# Patient Record
Sex: Male | Born: 1995 | Race: White | Hispanic: No | Marital: Married | Smoking: Never smoker
Health system: Southern US, Community
[De-identification: ages and names within clinical notes are randomized; demographics above are authoritative.]

## PROBLEM LIST (undated history)

## (undated) HISTORY — PX: TONSILLECTOMY AND ADENOIDECTOMY: SUR1326

---
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
## 2017-01-01 ENCOUNTER — Encounter: Payer: Self-pay | Admitting: Physical Therapy

## 2017-01-01 ENCOUNTER — Ambulatory Visit: Payer: 59 | Admitting: Physical Therapy

## 2017-01-01 DIAGNOSIS — M25511 Pain in right shoulder: Secondary | ICD-10-CM | POA: Diagnosis not present

## 2017-01-01 DIAGNOSIS — M25611 Stiffness of right shoulder, not elsewhere classified: Secondary | ICD-10-CM

## 2017-01-01 DIAGNOSIS — R2231 Localized swelling, mass and lump, right upper limb: Secondary | ICD-10-CM

## 2017-01-01 NOTE — Therapy (Signed)
[place], [place], [address] Phone: [phone]   Fax:  [phone]  Physical Therapy Treatment  Patient Details  Name: Omar Anderson MRN: [id number] Date of Birth: 09-20-1995 Referring Provider: Jerline Pain  Encounter Date: 01/01/2017      PT End of Session - 01/01/17 0829    Visit Number 10   Date for PT Re-Evaluation 01/28/17   PT Start Time 0758   PT Stop Time 0847   PT Time Calculation (min) 49 min   Activity Tolerance Patient tolerated treatment well   Behavior During Therapy [facility] for tasks assessed/performed      History reviewed. No pertinent past medical history.  Past Surgical History:  Procedure Laterality Date  . TONSILLECTOMY AND ADENOIDECTOMY      There were no vitals filed for this visit.      Subjective Assessment - 01/01/17 0800    Subjective Reports he was sore after the last visit and thought he saw some bruising above the clavicle   Currently in Pain? Yes   Pain Score 4    Pain Location Shoulder   Pain Orientation Right;Anterior   Aggravating Factors  exercises "made me sore"                         [facility] Adult PT Treatment/Exercise - 01/01/17 0001      Shoulder Exercises: Standing   External Rotation Right;20 reps;Theraband   Theraband Level (Shoulder External Rotation) Level 1 (Yellow)   Internal Rotation Strengthening;Right;20 reps;Theraband   Theraband Level (Shoulder Internal Rotation) Level 1 (Yellow)   Extension Strengthening;Both;20 reps;Theraband   Theraband Level (Shoulder Extension) Level 2 (Red)   Row Strengthening;Both;20 reps;Theraband   Theraband Level (Shoulder Row) Level 2 (Red)   Other Standing Exercises Wand exercises all directions     Shoulder Exercises: ROM/Strengthening   UBE (Upper Arm Bike) L 5 3 fwd/3 back   Other ROM/Strengthening Exercises seated row 10#   Other ROM/Strengthening Exercises gentle weight  bearing through the arm on table, weight shift back and forth, 10# triceps in a 1/2 ROM, 2# biceps all with elbow at side, 3# shrugs     Electrical Stimulation   Electrical Stimulation Location right upper trap area   Electrical Stimulation Action IFC   Electrical Stimulation Parameters seated   Electrical Stimulation Goals Pain     Vasopneumatic   Number Minutes Vasopneumatic  15 minutes   Vasopnuematic Location  Shoulder   Vasopneumatic Pressure Low   Vasopneumatic Temperature  36     Manual Therapy   Passive ROM RT shld all motions                  PT Short Term Goals - 12/14/16 0925      PT SHORT TERM GOAL #1   Title independent with initial HEP   Status Achieved           PT Long Term Goals - 12/21/16 1353      PT LONG TERM GOAL #1   Title decrease pain 50%   Status Partially Met     PT LONG TERM GOAL #4   Title dresss and do hair without difficulty   Status Partially Met               Plan - 01/01/17 0830    Clinical Impression Statement Patient had some increased pain after the last treatment, so I backed down.  All of his ROM is good except abduction causes him pain.   PT Next Visit Plan slowly advance plan/protocol as pain is our guide   Consulted and Agree with Plan of Care Patient      Patient will benefit from skilled therapeutic intervention in order to improve the following deficits and impairments:  Decreased mobility, Decreased strength, Postural dysfunction, Improper body mechanics, Pain, Increased muscle spasms, Decreased range of motion, Impaired UE functional use  Visit Diagnosis: Acute pain of right shoulder  Stiffness of right shoulder, not elsewhere classified  Localized swelling, mass and lump, right upper limb     Problem List Patient Active Problem List   Diagnosis Date Noted  . Sports physical 02/06/2013    Sumner Boast., PT 01/01/2017, 8:31 AM  [place] [address] [place], [place], [address] Phone: [phone]   Fax:  [phone]  Name: Omar Anderson MRN: [id number] Date of Birth: 04-Nov-1995

## 2017-01-05 ENCOUNTER — Ambulatory Visit: Payer: 59 | Admitting: Physical Therapy

## 2017-01-05 DIAGNOSIS — M25511 Pain in right shoulder: Secondary | ICD-10-CM | POA: Diagnosis not present

## 2017-01-05 DIAGNOSIS — M25611 Stiffness of right shoulder, not elsewhere classified: Secondary | ICD-10-CM

## 2017-01-05 DIAGNOSIS — R2231 Localized swelling, mass and lump, right upper limb: Secondary | ICD-10-CM

## 2017-01-05 NOTE — Therapy (Signed)
[place], [place], [address] Phone: [phone]   Fax:  [phone]  Physical Therapy Treatment  Patient Details  Name: Omar Anderson MRN: [id number] Date of Birth: [birth year] Referring Provider: Jerline Pain  Encounter Date: 01/05/2017      PT End of Session - 01/05/17 0828    Visit Number 11   Date for PT Re-Evaluation 01/28/17   PT Start Time 0800   PT Stop Time 0855   PT Time Calculation (min) 55 min   Activity Tolerance Patient tolerated treatment well   Behavior During Therapy [facility] for tasks assessed/performed      No past medical history on file.  Past Surgical History:  Procedure Laterality Date  . TONSILLECTOMY AND ADENOIDECTOMY      There were no vitals filed for this visit.      Subjective Assessment - 01/05/17 0803    Subjective Patient reports not sore today, some tightness with activity   Currently in Pain? Yes   Pain Score 1    Pain Location Shoulder                         [facility] Adult PT Treatment/Exercise - 01/05/17 0001      Shoulder Exercises: Standing   External Rotation Right;20 reps;Theraband   Theraband Level (Shoulder External Rotation) Level 1 (Yellow)   Internal Rotation Strengthening;Right;20 reps;Theraband   Theraband Level (Shoulder Internal Rotation) Level 1 (Yellow)   Extension Strengthening;Both;20 reps;Theraband   Theraband Level (Shoulder Extension) Level 2 (Red)   Row Strengthening;Both;20 reps;Theraband   Theraband Level (Shoulder Row) Level 2 (Red)   Other Standing Exercises Wand exercises all directions     Shoulder Exercises: ROM/Strengthening   UBE (Upper Arm Bike) L 5 3 fwd/3 back   Wall Wash flexion and circles at head high   Other ROM/Strengthening Exercises seated row 10#   Other ROM/Strengthening Exercises gentle weight bearing through the arm on table, weight shift back and forth, 10# triceps in a 1/2 ROM,  2# biceps all with elbow at side, 3# shrugs     Shoulder Exercises: Stretch   Corner Stretch 3 reps;10 seconds     Electrical Stimulation   Electrical Stimulation Location right upper trap area   Chartered certified accountant IFC   Electrical Stimulation Parameters seated   Electrical Stimulation Goals Pain     Vasopneumatic   Number Minutes Vasopneumatic  15 minutes   Vasopnuematic Location  Shoulder   Vasopneumatic Pressure Low   Vasopneumatic Temperature  36                  PT Short Term Goals - 12/14/16 0925      PT SHORT TERM GOAL #1   Title independent with initial HEP   Status Achieved           PT Long Term Goals - 12/21/16 1353      PT LONG TERM GOAL #1   Title decrease pain 50%   Status Partially Met     PT LONG TERM GOAL #4   Title dresss and do hair without difficulty   Status Partially Met               Plan - 01/05/17 0837    Clinical Impression Statement Patient reports les pain after the last treatment, he is having some pain with shoulder elevation and abduction.  Horizontal abduction is very tihgt and painful  as well.   PT Next Visit Plan slowly advance plan/protocol as pain is our guide   Consulted and Agree with Plan of Care Patient      Patient will benefit from skilled therapeutic intervention in order to improve the following deficits and impairments:  Decreased mobility, Decreased strength, Postural dysfunction, Improper body mechanics, Pain, Increased muscle spasms, Decreased range of motion, Impaired UE functional use  Visit Diagnosis: Acute pain of right shoulder  Stiffness of right shoulder, not elsewhere classified  Localized swelling, mass and lump, right upper limb     Problem List Patient Active Problem List   Diagnosis Date Noted  . Sports physical 02/06/2013    Sumner Boast., PT 01/05/2017, 8:39 AM  [place] [address] [address]  [place], [place], [address] Phone: [phone]   Fax:  [phone]  Name: Omar Anderson MRN: [id number] Date of Birth: March 19, 1996

## 2017-01-09 ENCOUNTER — Ambulatory Visit: Payer: 59 | Admitting: Physical Therapy

## 2017-01-09 ENCOUNTER — Encounter: Payer: Self-pay | Admitting: Physical Therapy

## 2017-01-09 DIAGNOSIS — R2231 Localized swelling, mass and lump, right upper limb: Secondary | ICD-10-CM

## 2017-01-09 DIAGNOSIS — M25611 Stiffness of right shoulder, not elsewhere classified: Secondary | ICD-10-CM

## 2017-01-09 DIAGNOSIS — M25511 Pain in right shoulder: Secondary | ICD-10-CM

## 2017-01-09 NOTE — Therapy (Signed)
[place], [place], [address] Phone: [phone]   Fax:  [phone]  Physical Therapy Treatment  Patient Details  Name: Omar Anderson MRN: [id number] Date of Birth: 06-17-1996 Referring Provider: Jerline Pain  Encounter Date: 01/09/2017      PT End of Session - 01/09/17 0926    Visit Number 12   Date for PT Re-Evaluation 01/28/17   PT Start Time 0850   PT Stop Time 0950   PT Time Calculation (min) 60 min      History reviewed. No pertinent past medical history.  Past Surgical History:  Procedure Laterality Date  . TONSILLECTOMY AND ADENOIDECTOMY      There were no vitals filed for this visit.      Subjective Assessment - 01/09/17 0850    Subjective pain after waking in morning and with certain mvmt   Currently in Pain? Yes   Pain Score 2    Pain Location Shoulder   Pain Orientation Right;Anterior            [facility] PT Assessment - 01/09/17 0001      AROM   AROM Assessment Site Shoulder   Right/Left Shoulder Right  standing   Right Shoulder Flexion 146 Degrees  with pain   Right Shoulder ABduction 98 Degrees                     [facility] Adult PT Treatment/Exercise - 01/09/17 0001      Shoulder Exercises: Standing   External Rotation Strengthening;Right;15 reps  5# pulley with some PTA assist as pt fatigued   Internal Rotation Strengthening;Right;15 reps  5# pulley   Other Standing Exercises 1# flex,abd and chest press 10 each   Other Standing Exercises 1# cabinet reaching flex and abd     Shoulder Exercises: ROM/Strengthening   UBE (Upper Arm Bike) L 5 3 fwd/3 back   Wall Wash flexion and circles at head high  plus horz abd   Wall Pushups 20 reps  with ball   Pushups Limitations roll ball up and down wall 20   Other ROM/Strengthening Exercises 5# pulleyes 15 reps 3 way     Electrical Stimulation   Electrical Stimulation Location right upper trap  area   Electrical Stimulation Action IFC   Electrical Stimulation Parameters seated   Electrical Stimulation Goals Pain     Vasopneumatic   Number Minutes Vasopneumatic  15 minutes   Vasopnuematic Location  Shoulder   Vasopneumatic Pressure Low   Vasopneumatic Temperature  34     Manual Therapy   Manual Therapy Taping   Passive ROM RT shld all motions   Kinesiotex Create Space  scar and rhom                  PT Short Term Goals - 12/14/16 0925      PT SHORT TERM GOAL #1   Title independent with initial HEP   Status Achieved           PT Long Term Goals - 12/21/16 1353      PT LONG TERM GOAL #1   Title decrease pain 50%   Status Partially Met     PT LONG TERM GOAL #4   Title dresss and do hair without difficulty   Status Partially Met               Plan - 01/09/17 0927    Clinical Impression Statement improved  func ROM guarded and at times self limiting d/t pain. pt is 12 weeks out so working on restoring full ROM. C/O scar pain so trial of kinsio taping   PT Next Visit Plan 12 weeks s/p surgery increase ROM, lt weights      Patient will benefit from skilled therapeutic intervention in order to improve the following deficits and impairments:     Visit Diagnosis: Acute pain of right shoulder  Stiffness of right shoulder, not elsewhere classified  Localized swelling, mass and lump, right upper limb     Problem List Patient Active Problem List   Diagnosis Date Noted  . Sports physical 02/06/2013    Shawna Kiener,ANGIE PTA 01/09/2017, 9:30 AM  [place] [address] [place], [place], [address] Phone: [phone]   Fax:  [phone]  Name: Omar Anderson MRN: [id number] Date of Birth: 1996-04-20

## 2017-01-11 ENCOUNTER — Ambulatory Visit: Payer: 59 | Admitting: Physical Therapy

## 2017-01-11 ENCOUNTER — Encounter: Payer: Self-pay | Admitting: Physical Therapy

## 2017-01-11 DIAGNOSIS — M25511 Pain in right shoulder: Secondary | ICD-10-CM

## 2017-01-11 DIAGNOSIS — R2231 Localized swelling, mass and lump, right upper limb: Secondary | ICD-10-CM

## 2017-01-11 DIAGNOSIS — M25611 Stiffness of right shoulder, not elsewhere classified: Secondary | ICD-10-CM

## 2017-01-11 NOTE — Therapy (Signed)
[place], [place], [address] Phone: [phone]   Fax:  [phone]  Physical Therapy Treatment  Patient Details  Name: Omar Anderson MRN: [id number] Date of Birth: 03/28/1996 Referring Provider: Jerline Pain  Encounter Date: 01/11/2017      PT End of Session - 01/11/17 0924    Visit Number 13   Date for PT Re-Evaluation 01/28/17   PT Start Time 0848   PT Stop Time 0950   PT Time Calculation (min) 62 min      History reviewed. No pertinent past medical history.  Past Surgical History:  Procedure Laterality Date  . TONSILLECTOMY AND ADENOIDECTOMY      There were no vitals filed for this visit.      Subjective Assessment - 01/11/17 0849    Subjective tape maybe helped me relax some, not too sore   Currently in Pain? Yes   Pain Score 3    Pain Location Shoulder   Pain Orientation Right                         [facility] Adult PT Treatment/Exercise - 01/11/17 0001      Shoulder Exercises: Standing   Other Standing Exercises yellow tband 3 way stab 15 each on wall  2# standing free wt ex 10 reps each   Other Standing Exercises 2# cabinet reaching flex and abd  2# wall slides 4 way 15 times each     Shoulder Exercises: ROM/Strengthening   UBE (Upper Arm Bike) L 5 3 fwd/3 back  lat pull 15# 2 sets 10   Cybex Press 20 reps  with serratus punch 5#   Other ROM/Strengthening Exercises 5# pulleyes 15 reps 3 way  lat pull 15# 2 sets 10   Other ROM/Strengthening Exercises nustep push/pull/horz abd/add  L 3 2 min each     Electrical Stimulation   Electrical Stimulation Location right upper trap area   Electrical Stimulation Action IFC   Electrical Stimulation Parameters seated   Electrical Stimulation Goals Pain     Vasopneumatic   Number Minutes Vasopneumatic  15 minutes   Vasopnuematic Location  Shoulder   Vasopneumatic Pressure Low   Vasopneumatic Temperature   34                  PT Short Term Goals - 12/14/16 0925      PT SHORT TERM GOAL #1   Title independent with initial HEP   Status Achieved           PT Long Term Goals - 01/11/17 0924      PT LONG TERM GOAL #1   Title decrease pain 50%   Status Partially Met     PT LONG TERM GOAL #2   Title increase right shoulder ROM to WNL's   Status Partially Met     PT LONG TERM GOAL #3   Title lift 5# over head    Status Partially Met     PT LONG TERM GOAL #4   Title dresss and do hair without difficulty   Status Partially Met               Plan - 01/11/17 0925    Clinical Impression Statement progressing with all LTGS per protocol parameters. incresed func motion noted today. increased to 2# today   PT Next Visit Plan 12 weeks s/p surgery increase ROM, lt weights  Patient will benefit from skilled therapeutic intervention in order to improve the following deficits and impairments:     Visit Diagnosis: Acute pain of right shoulder  Stiffness of right shoulder, not elsewhere classified  Localized swelling, mass and lump, right upper limb     Problem List Patient Active Problem List   Diagnosis Date Noted  . Sports physical 02/06/2013    Brinda Focht,ANGIE PTA 01/11/2017, 9:26 AM  [place], [place], [address] Phone: [phone]   Fax:  [phone]  Name: Omar Anderson MRN: [id number] Date of Birth: [birth year]

## 2017-01-16 ENCOUNTER — Encounter: Payer: Self-pay | Admitting: Physical Therapy

## 2017-01-16 ENCOUNTER — Ambulatory Visit: Payer: 59 | Attending: Student | Admitting: Physical Therapy

## 2017-01-16 DIAGNOSIS — M25511 Pain in right shoulder: Secondary | ICD-10-CM | POA: Insufficient documentation

## 2017-01-16 DIAGNOSIS — R2231 Localized swelling, mass and lump, right upper limb: Secondary | ICD-10-CM | POA: Insufficient documentation

## 2017-01-16 DIAGNOSIS — M25611 Stiffness of right shoulder, not elsewhere classified: Secondary | ICD-10-CM | POA: Insufficient documentation

## 2017-01-16 NOTE — Therapy (Signed)
[place], [place], [address] Phone: [phone]   Fax:  [phone]  Physical Therapy Treatment  Patient Details  Name: Omar Anderson MRN: [id number] Date of Birth: [birth year] Referring Provider: Jerline Pain  Encounter Date: 01/16/2017      PT End of Session - 01/16/17 1632    Visit Number 14   Date for PT Re-Evaluation 01/28/17   PT Start Time 1600   PT Stop Time 1650   PT Time Calculation (min) 50 min      History reviewed. No pertinent past medical history.  Past Surgical History:  Procedure Laterality Date  . TONSILLECTOMY AND ADENOIDECTOMY      There were no vitals filed for this visit.      Subjective Assessment - 01/16/17 1602    Subjective "Tape helped me relax it a little more"   Currently in Pain? Yes   Pain Score 2    Pain Location Shoulder   Pain Orientation Right                         [facility] Adult PT Treatment/Exercise - 01/16/17 0001      Shoulder Exercises: Supine   External Rotation 20 reps;Right;AROM   Internal Rotation 20 reps;AROM;Right     Shoulder Exercises: Standing   Flexion Weights;12 reps  x2   Shoulder Flexion Weight (lbs) 1   ABduction 10 reps;AROM   Extension Strengthening;Both;20 reps;Theraband   Theraband Level (Shoulder Extension) Level 2 (Red)   Row Strengthening;Both;20 reps;Theraband   Theraband Level (Shoulder Row) Level 2 (Red)   Other Standing Exercises yellow tband 3 way stab 15 each on wall; wall pushup position CW/CCW with pillow case x10 each    Other Standing Exercises 2# cabinet reaching flex and abd     Shoulder Exercises: ROM/Strengthening   UBE (Upper Arm Bike) L 5 3 fwd/3 back   Cybex Press 20 reps  5lb serratus press     Vasopneumatic   Number Minutes Vasopneumatic  15 minutes   Vasopnuematic Location  Shoulder   Vasopneumatic Pressure Low   Vasopneumatic Temperature  34                   PT Short Term Goals - 12/14/16 0925      PT SHORT TERM GOAL #1   Title independent with initial HEP   Status Achieved           PT Long Term Goals - 01/11/17 0924      PT LONG TERM GOAL #1   Title decrease pain 50%   Status Partially Met     PT LONG TERM GOAL #2   Title increase right shoulder ROM to WNL's   Status Partially Met     PT LONG TERM GOAL #3   Title lift 5# over head    Status Partially Met     PT LONG TERM GOAL #4   Title dresss and do hair without difficulty   Status Partially Met               Plan - 01/16/17 1632    Clinical Impression Statement Pt has good carryover from previous treatment using 2lb with functional reach. No R shoulder elevation noted with active shoulder flexion. AROM in supine with R shoulder external and internal rotation, pt reports some collar bon pain with active ER.    Rehab Potential Good   PT  Frequency 2x / week   PT Duration 8 weeks   PT Treatment/Interventions ADLs/Self Care Home Management;Cryotherapy;Electrical Stimulation;Patient/family education;Therapeutic exercise;Therapeutic activities;Manual techniques;Vasopneumatic Device;Passive range of motion   PT Next Visit Plan 12 weeks s/p surgery increase ROM      Patient will benefit from skilled therapeutic intervention in order to improve the following deficits and impairments:  Decreased mobility, Decreased strength, Postural dysfunction, Improper body mechanics, Pain, Increased muscle spasms, Decreased range of motion, Impaired UE functional use  Visit Diagnosis: Acute pain of right shoulder  Localized swelling, mass and lump, right upper limb  Stiffness of right shoulder, not elsewhere classified     Problem List Patient Active Problem List   Diagnosis Date Noted  . Sports physical 02/06/2013    Scot Jun, PTA 01/16/2017, 4:36 PM  [place] [address] [facility] [place], [place], [address] Phone: [phone]   Fax:  [phone]  Name: Omar Anderson MRN: [id number] Date of Birth: [birth year]

## 2017-01-18 ENCOUNTER — Ambulatory Visit: Payer: 59 | Admitting: Physical Therapy

## 2017-01-18 ENCOUNTER — Encounter: Payer: Self-pay | Admitting: Physical Therapy

## 2017-01-18 DIAGNOSIS — M25611 Stiffness of right shoulder, not elsewhere classified: Secondary | ICD-10-CM

## 2017-01-18 DIAGNOSIS — M25511 Pain in right shoulder: Secondary | ICD-10-CM

## 2017-01-18 DIAGNOSIS — R2231 Localized swelling, mass and lump, right upper limb: Secondary | ICD-10-CM

## 2017-01-18 NOTE — Therapy (Signed)
[place], [place], [address] Phone: [phone]   Fax:  [phone]  Physical Therapy Treatment  Patient Details  Name: STEPFON Anderson MRN: [id number] Date of Birth: 20-Dec-1995 Referring Provider: Jerline Pain  Encounter Date: 01/18/2017      PT End of Session - 01/18/17 1015    Visit Number 15   Date for PT Re-Evaluation 01/28/17   PT Start Time 0930   PT Stop Time 1030   PT Time Calculation (min) 60 min   Activity Tolerance Patient tolerated treatment well   Behavior During Therapy [facility] for tasks assessed/performed      History reviewed. No pertinent past medical history.  Past Surgical History:  Procedure Laterality Date  . TONSILLECTOMY AND ADENOIDECTOMY      There were no vitals filed for this visit.      Subjective Assessment - 01/18/17 0931    Subjective "doing pretty good"   Currently in Pain? Yes   Pain Score 3    Pain Location Shoulder   Pain Orientation Right   Pain Descriptors / Indicators --  Scar pain                         [facility] Adult PT Treatment/Exercise - 01/18/17 0001      Shoulder Exercises: Standing   External Rotation Strengthening;Right;15 reps;Theraband  x2   Theraband Level (Shoulder External Rotation) Level 1 (Yellow)   Internal Rotation Strengthening;Right;15 reps  x2   Theraband Level (Shoulder Internal Rotation) Level 1 (Yellow)   Flexion Weights;12 reps  x2   Shoulder Flexion Weight (lbs) 1   ABduction AROM;20 reps;Both   Extension Strengthening;Both;20 reps;Theraband   Theraband Level (Shoulder Extension) Level 3 (Green)   Row Strengthening;Both;20 reps;Theraband   Theraband Level (Shoulder Row) Level 3 (Green)   Other Standing Exercises yellow tband 3 way stab 15 each on wall; wall pushup position CW/CCW with pillow case x10 each    Other Standing Exercises 2# cabinet reaching flex and abd     Shoulder Exercises:  ROM/Strengthening   UBE (Upper Arm Bike) L 5 3 fwd/3 back   Cybex Press 20 reps  5lb, setrratus punches    Other ROM/Strengthening Exercises Seated Rows 20lb 3x10      Vasopneumatic   Number Minutes Vasopneumatic  15 minutes   Vasopnuematic Location  Shoulder   Vasopneumatic Pressure Low   Vasopneumatic Temperature  34                  PT Short Term Goals - 12/14/16 0925      PT SHORT TERM GOAL #1   Title independent with initial HEP   Status Achieved           PT Long Term Goals - 01/11/17 0924      PT LONG TERM GOAL #1   Title decrease pain 50%   Status Partially Met     PT LONG TERM GOAL #2   Title increase right shoulder ROM to WNL's   Status Partially Met     PT LONG TERM GOAL #3   Title lift 5# over head    Status Partially Met     PT LONG TERM GOAL #4   Title dresss and do hair without difficulty   Status Partially Met               Plan - 01/18/17 1015    Clinical Impression  Statement Tolerated all of today's exercises well, does report some pain with increase reps. Pt has full R shoulder PROM.   Rehab Potential Good   PT Frequency 2x / week   PT Duration 8 weeks   PT Next Visit Plan 15 weeks s/p surgery increase ROM      Patient will benefit from skilled therapeutic intervention in order to improve the following deficits and impairments:  Decreased mobility, Decreased strength, Postural dysfunction, Improper body mechanics, Pain, Increased muscle spasms, Decreased range of motion, Impaired UE functional use  Visit Diagnosis: Acute pain of right shoulder  Localized swelling, mass and lump, right upper limb  Stiffness of right shoulder, not elsewhere classified     Problem List Patient Active Problem List   Diagnosis Date Noted  . Sports physical 02/06/2013    Scot Jun, PTA 01/18/2017, 10:24 AM  [place], [place],  [address] Phone: [phone]   Fax:  [phone]  Name: Omar Anderson MRN: [id number] Date of Birth: 11-Feb-1996

## 2017-01-23 ENCOUNTER — Ambulatory Visit: Payer: 59 | Admitting: Physical Therapy

## 2017-01-23 ENCOUNTER — Encounter: Payer: Self-pay | Admitting: Physical Therapy

## 2017-01-23 DIAGNOSIS — M25511 Pain in right shoulder: Secondary | ICD-10-CM

## 2017-01-23 DIAGNOSIS — R2231 Localized swelling, mass and lump, right upper limb: Secondary | ICD-10-CM

## 2017-01-23 DIAGNOSIS — M25611 Stiffness of right shoulder, not elsewhere classified: Secondary | ICD-10-CM

## 2017-01-23 NOTE — Therapy (Signed)
[place], [place], [address] Phone: [phone]   Fax:  [phone]  Physical Therapy Treatment  Patient Details  Name: Omar Anderson MRN: [id number] Date of Birth: Jan 17, 1996 Referring Provider: Jerline Pain  Encounter Date: 01/23/2017      PT End of Session - 01/23/17 1020    Visit Number 16   Date for PT Re-Evaluation 01/28/17   PT Start Time 0932   PT Stop Time 1030   PT Time Calculation (min) 58 min   Activity Tolerance Patient tolerated treatment well   Behavior During Therapy [facility] for tasks assessed/performed      History reviewed. No pertinent past medical history.  Past Surgical History:  Procedure Laterality Date  . TONSILLECTOMY AND ADENOIDECTOMY      There were no vitals filed for this visit.      Subjective Assessment - 01/23/17 0935    Subjective Pt reports some scar pain. Says that it gets really irritated when anything touches it. He still has some soreness and irritation after his exercises but says this isn't nearly as bad as it used to be.    Patient Stated Goals full use of the arm and no issues   Currently in Pain? No/denies   Pain Score 0-No pain                         [facility] Adult PT Treatment/Exercise - 01/23/17 0001      Shoulder Exercises: Prone   Other Prone Exercises [place]'s exercises 1# at 0, 45, 90 degrees, and no weight at 135 and 180 degrees     Shoulder Exercises: Standing   External Rotation Strengthening;Both;10 reps;Theraband  2 sets   Theraband Level (Shoulder External Rotation) Level 2 (Red)   Other Standing Exercises wall push ups 2x10,      Shoulder Exercises: ROM/Strengthening   UBE (Upper Arm Bike) L 5 3 fwd/3 back   Cybex Row Limitations 20# 2X15   Wall Wash 3# shoulder flexion 1x10, 1# CW/CCW circles 1x10, 1# scaption 1x10    "W" Arms 2x10, no weight   Rhythmic Stabilization, Seated standing with unweighted  ball   Other ROM/Strengthening Exercises Lat pull downs 20# 2x10, away from end range   Other ROM/Strengthening Exercises Triceps #25 2x15, Biceps 5# 2x15     Vasopneumatic   Number Minutes Vasopneumatic  15 minutes   Vasopnuematic Location  Shoulder   Vasopneumatic Pressure Low   Vasopneumatic Temperature  34                PT Education - 01/23/17 1027    Education Details Explaination of scar desensitization.    Person(s) Educated Patient   Methods Explanation   Comprehension Verbalized understanding;Tactile cues required          PT Short Term Goals - 12/14/16 0925      PT SHORT TERM GOAL #1   Title independent with initial HEP   Status Achieved           PT Long Term Goals - 01/23/17 1031      PT LONG TERM GOAL #1   Title decrease pain 50%   Time 8   Period Weeks   Status Partially Met     PT LONG TERM GOAL #2   Title increase right shoulder ROM to WNL's   Time 8   Period Weeks   Status Partially Met  PT LONG TERM GOAL #3   Title lift 5# over head    Time 8   Period Weeks   Status Partially Met     PT LONG TERM GOAL #4   Title dresss and do hair without difficulty   Time 8   Period Weeks   Status Partially Met               Plan - 01/23/17 1021    Clinical Impression Statement Pt did well with todays exercises. He has some pain with end range movements and weakness in the scapular plane. His PROM is good, but he has some pain with end range IR. Pt also reports scar sensitivity to pressure and light touch.  PT recommended desensitization by rubbing soft cotton over the scar.    PT Treatment/Interventions ADLs/Self Care Home Management;Cryotherapy;Electrical Stimulation;Patient/family education;Therapeutic exercise;Therapeutic activities;Manual techniques;Vasopneumatic Device;Passive range of motion   PT Next Visit Plan Continue to progress strengthening and ROM.    Consulted and Agree with Plan of Care Patient      Patient will  benefit from skilled therapeutic intervention in order to improve the following deficits and impairments:  Decreased mobility, Decreased strength, Postural dysfunction, Improper body mechanics, Pain, Increased muscle spasms, Decreased range of motion, Impaired UE functional use  Visit Diagnosis: Acute pain of right shoulder  Localized swelling, mass and lump, right upper limb  Stiffness of right shoulder, not elsewhere classified     Problem List Patient Active Problem List   Diagnosis Date Noted  . Sports physical 02/06/2013    Lennart Pall, SPT 01/23/2017, 10:33 AM  [place], [place], [address] Phone: [phone]   Fax:  [phone]  Name: Omar Anderson MRN: [id number] Date of Birth: 07-11-1996

## 2017-01-25 ENCOUNTER — Ambulatory Visit: Payer: 59 | Admitting: Physical Therapy

## 2017-01-25 ENCOUNTER — Encounter: Payer: Self-pay | Admitting: Physical Therapy

## 2017-01-25 DIAGNOSIS — M25511 Pain in right shoulder: Secondary | ICD-10-CM

## 2017-01-25 DIAGNOSIS — R2231 Localized swelling, mass and lump, right upper limb: Secondary | ICD-10-CM

## 2017-01-25 DIAGNOSIS — M25611 Stiffness of right shoulder, not elsewhere classified: Secondary | ICD-10-CM

## 2017-01-25 NOTE — Therapy (Signed)
[place], [place], [address] Phone: [phone]   Fax:  [phone]  Physical Therapy Treatment  Patient Details  Name: Omar Anderson MRN: [id number] Date of Birth: November 01, 1995 Referring Provider: Jerline Pain  Encounter Date: 01/25/2017      PT End of Session - 01/25/17 1012    Activity Tolerance Patient tolerated treatment well   Behavior During Therapy [facility] for tasks assessed/performed      History reviewed. No pertinent past medical history.  Past Surgical History:  Procedure Laterality Date  . TONSILLECTOMY AND ADENOIDECTOMY      There were no vitals filed for this visit.      Subjective Assessment - 01/25/17 0937    Subjective "Its all right, kinda sore, hard to explain, not muscle soreness, just tired"   Currently in Pain? No/denies   Pain Score 0-No pain                         [facility] Adult PT Treatment/Exercise - 01/25/17 0001      Shoulder Exercises: Supine   Protraction Right;10 reps  x2   Protraction Weight (lbs) 1   External Rotation 20 reps;Right;AROM   Internal Rotation 20 reps;AROM;Right     Shoulder Exercises: Seated   Other Seated Exercises Seated bent over Rows 3lb 2x10   Other Seated Exercises Rows 20lb 2x10      Shoulder Exercises: Standing   External Rotation Strengthening;Both;Theraband;15 reps  x2   Theraband Level (Shoulder External Rotation) Level 1 (Yellow)   Internal Rotation Strengthening;Right;15 reps   Theraband Level (Shoulder Internal Rotation) Level 1 (Yellow)   Other Standing Exercises wall push ups 2x10, Standing rev grip rows 20lb 2x10; Arcs with pillow case x10 x5    Other Standing Exercises 2# cabinet reaching flex and abd     Shoulder Exercises: ROM/Strengthening   UBE (Upper Arm Bike) L  3 fwd/3 back   Other ROM/Strengthening Exercises Triceps #25 2x15, Biceps 5# 2x15     Vasopneumatic   Number Minutes  Vasopneumatic  15 minutes   Vasopnuematic Location  Shoulder   Vasopneumatic Pressure Low   Vasopneumatic Temperature  34                  PT Short Term Goals - 12/14/16 0925      PT SHORT TERM GOAL #1   Title independent with initial HEP   Status Achieved           PT Long Term Goals - 01/23/17 1031      PT LONG TERM GOAL #1   Title decrease pain 50%   Time 8   Period Weeks   Status Partially Met     PT LONG TERM GOAL #2   Title increase right shoulder ROM to WNL's   Time 8   Period Weeks   Status Partially Met     PT LONG TERM GOAL #3   Title lift 5# over head    Time 8   Period Weeks   Status Partially Met     PT LONG TERM GOAL #4   Title dresss and do hair without difficulty   Time 8   Period Weeks   Status Partially Met               Plan - 01/25/17 1012    Clinical Impression Statement Pt reports no issues with most of today's activities, does reports  that wall push ups and shoulder arcs were not pleasant.   Rehab Potential Good   PT Frequency 2x / week   PT Duration 8 weeks   PT Treatment/Interventions ADLs/Self Care Home Management;Cryotherapy;Electrical Stimulation;Patient/family education;Therapeutic exercise;Therapeutic activities;Manual techniques;Vasopneumatic Device;Passive range of motion   PT Next Visit Plan Continue to progress strengthening and ROM.       Patient will benefit from skilled therapeutic intervention in order to improve the following deficits and impairments:  Decreased mobility, Decreased strength, Postural dysfunction, Improper body mechanics, Pain, Increased muscle spasms, Decreased range of motion, Impaired UE functional use  Visit Diagnosis: Acute pain of right shoulder  Localized swelling, mass and lump, right upper limb  Stiffness of right shoulder, not elsewhere classified     Problem List Patient Active Problem List   Diagnosis Date Noted  . Sports physical 02/06/2013    Scot Jun, PTA 01/25/2017, 10:14 AM  [place], [place], [address] Phone: [phone]   Fax:  [phone]  Name: CARNEY SAXTON MRN: [id number] Date of Birth: [birth year]

## 2017-01-30 ENCOUNTER — Encounter: Payer: Self-pay | Admitting: Physical Therapy

## 2017-01-30 ENCOUNTER — Ambulatory Visit: Payer: 59 | Admitting: Physical Therapy

## 2017-01-30 DIAGNOSIS — M25611 Stiffness of right shoulder, not elsewhere classified: Secondary | ICD-10-CM

## 2017-01-30 DIAGNOSIS — M25511 Pain in right shoulder: Secondary | ICD-10-CM

## 2017-01-30 DIAGNOSIS — R2231 Localized swelling, mass and lump, right upper limb: Secondary | ICD-10-CM

## 2017-01-30 NOTE — Therapy (Signed)
[place], [place], [address] Phone: [phone]   Fax:  [phone]  Physical Therapy Treatment  Patient Details  Name: Omar Anderson MRN: [id number] Date of Birth: 07-03-1996 Referring Provider: Jerline Pain  Encounter Date: 01/30/2017      PT End of Session - 01/30/17 1013    Visit Number 18   Date for PT Re-Evaluation 01/28/17   PT Start Time 0935   PT Stop Time 1028   PT Time Calculation (min) 53 min   Activity Tolerance Patient tolerated treatment well   Behavior During Therapy [facility] for tasks assessed/performed      History reviewed. No pertinent past medical history.  Past Surgical History:  Procedure Laterality Date  . TONSILLECTOMY AND ADENOIDECTOMY      There were no vitals filed for this visit.      Subjective Assessment - 01/30/17 0937    Subjective "Its not bad, it was sore yesterday, kinda tired from using it"   Currently in Pain? No/denies   Pain Score 0-No pain            [facility] PT Assessment - 01/30/17 0001      AROM   AROM Assessment Site Shoulder   Right/Left Shoulder Right  standing   Right Shoulder Flexion 159 Degrees  with pain   Right Shoulder ABduction 129 Degrees  with pain                     [facility] Adult PT Treatment/Exercise - 01/30/17 0001      Shoulder Exercises: Standing   External Rotation Strengthening;Both;Theraband;15 reps  x2   Theraband Level (Shoulder External Rotation) Level 1 (Yellow)   Internal Rotation Strengthening;Right;Weights;10 reps   Internal Rotation Weight (lbs) 5   Other Standing Exercises wall push ups 2x10, Standing rev grip rows 20lb 2x10; Arcs with pillow case x10 x5    Other Standing Exercises 2# cabinet reaching flex and abd     Shoulder Exercises: ROM/Strengthening   UBE (Upper Arm Bike) L  3 fwd/3 back   Cybex Press 20 reps;1 plate  serratus pushes    Other ROM/Strengthening Exercises Rows  & Lats 20lb 2x10    Other ROM/Strengthening Exercises Triceps #25 2x15, Biceps 15# 2x10                  PT Short Term Goals - 12/14/16 7357      PT SHORT TERM GOAL #1   Title independent with initial HEP   Status Achieved           PT Long Term Goals - 01/30/17 1015      PT LONG TERM GOAL #2   Title increase right shoulder ROM to WNL's   Status Partially Met               Plan - 01/30/17 1013    Clinical Impression Statement Pt is progressing towards goals, increase R shoulder AROM, does reports that his SCJ was popping with reverse on UBE. Reports pain with shoulder arcs.   Rehab Potential Good   PT Frequency 2x / week   PT Duration 8 weeks   PT Treatment/Interventions ADLs/Self Care Home Management;Cryotherapy;Electrical Stimulation;Patient/family education;Therapeutic exercise;Therapeutic activities;Manual techniques;Vasopneumatic Device;Passive range of motion   PT Next Visit Plan Continue to progress strengthening and ROM.       Patient will benefit from skilled therapeutic intervention in order to improve the following deficits and impairments:  Decreased mobility, Decreased strength, Postural dysfunction, Improper body mechanics, Pain, Increased muscle spasms, Decreased range of motion, Impaired UE functional use  Visit Diagnosis: Acute pain of right shoulder  Localized swelling, mass and lump, right upper limb  Stiffness of right shoulder, not elsewhere classified     Problem List Patient Active Problem List   Diagnosis Date Noted  . Sports physical 02/06/2013    Scot Jun, PTA 01/30/2017, 10:15 AM  [place], [place], [address] Phone: [phone]   Fax:  [phone]  Name: NADER BOYS MRN: [id number] Date of Birth: 1996/03/05

## 2017-01-31 ENCOUNTER — Ambulatory Visit: Payer: 59 | Admitting: Physical Therapy

## 2017-01-31 ENCOUNTER — Encounter: Payer: Self-pay | Admitting: Physical Therapy

## 2017-01-31 DIAGNOSIS — M25511 Pain in right shoulder: Secondary | ICD-10-CM | POA: Diagnosis not present

## 2017-01-31 DIAGNOSIS — M25611 Stiffness of right shoulder, not elsewhere classified: Secondary | ICD-10-CM

## 2017-01-31 NOTE — Therapy (Signed)
[facility] [address] [place], [place], [address] Phone: [phone]   Fax:  [phone]  Physical Therapy Treatment  Patient Details  Name: VIYAN ROSAMOND MRN: [id number] Date of Birth: [birth year] Referring Provider: Dorita Fray  Encounter Date: 01/31/2017      PT End of Session - 01/31/17 0928    Visit Number 19   Date for PT Re-Evaluation 02/28/17   PT Start Time 0845   PT Stop Time 0929   PT Time Calculation (min) 44 min   Activity Tolerance Patient tolerated treatment well   Behavior During Therapy [facility] for tasks assessed/performed      History reviewed. No pertinent past medical history.  Past Surgical History:  Procedure Laterality Date  . TONSILLECTOMY AND ADENOIDECTOMY      There were no vitals filed for this visit.      Subjective Assessment - 01/31/17 0846    Subjective Continues to reports soreness and some sensitivity to the scar area   Currently in Pain? Yes   Pain Score 1    Pain Location Shoulder   Pain Orientation Right                         [facility] Adult PT Treatment/Exercise - 01/31/17 0001      Shoulder Exercises: Standing   External Rotation Strengthening;Both;Theraband;15 reps   Theraband Level (Shoulder External Rotation) Level 2 (Red)   Internal Rotation Strengthening;Right;Weights;10 reps   Theraband Level (Shoulder Internal Rotation) Level 2 (Red)   Other Standing Exercises wall push ups 2x10, Standing rev grip rows 20lb 2x10; Arcs with pillow case x10 x5    Other Standing Exercises 3# and 4# overhead carry 150' each     Shoulder Exercises: ROM/Strengthening   UBE (Upper Arm Bike) 4 minutes constant work 30 watts   Cybex Press 20 reps;1 plate   Cybex Press Limitations serratus push at end range   Cybex Row Limitations 20# 2X15   Wall Pushups 20 reps   "W" Arms 2x10, no weight   Other ROM/Strengthening Exercises lats 20# 2x15   Other  ROM/Strengthening Exercises Triceps #25 2x15, Biceps 15# 2x10                PT Education - 01/31/17 0927    Education provided Yes   Education Details gave red tband, went over exercises to do on his vacation   Person(s) Educated Patient   Methods Explanation   Comprehension Verbalized understanding          PT Short Term Goals - 12/14/16 0925      PT SHORT TERM GOAL #1   Title independent with initial HEP   Status Achieved           PT Long Term Goals - 01/31/17 0934      PT LONG TERM GOAL #1   Title decrease pain 50%   Status Partially Met     PT LONG TERM GOAL #2   Title increase right shoulder ROM to WNL's   Status Partially Met     PT LONG TERM GOAL #3   Title lift 5# over head    Status Partially Met     PT LONG TERM GOAL #4   Title dresss and do hair without difficulty   Status Partially Met               Plan - 01/31/17 0929    Clinical Impression Statement  Patient is showing a good increase in his ROM, progressing toward his goals.  He is having some pain iwth overhead activities, he also reports some pain with weightbearing through the arm.  He reports mild discomfort with dressing and doing hair.  Overall doing very good, we have continued to adhere to the MD protocol.   PT Next Visit Plan progress as tolerated   Consulted and Agree with Plan of Care Patient      Patient will benefit from skilled therapeutic intervention in order to improve the following deficits and impairments:  Decreased mobility, Decreased strength, Postural dysfunction, Improper body mechanics, Pain, Increased muscle spasms, Decreased range of motion, Impaired UE functional use  Visit Diagnosis: Acute pain of right shoulder - Plan: PT plan of care cert/re-cert  Stiffness of right shoulder, not elsewhere classified - Plan: PT plan of care cert/re-cert     Problem List Patient Active Problem List   Diagnosis Date Noted  . Sports physical 02/06/2013     Sumner Boast., PT 01/31/2017, 9:36 AM  [place], [place], [address] Phone: [phone]   Fax:  [phone]  Name: AVISHAI REIHL MRN: [id number] Date of Birth: 11-Dec-1995

## 2017-02-01 ENCOUNTER — Ambulatory Visit: Payer: 59 | Admitting: Physical Therapy

## 2017-02-08 ENCOUNTER — Ambulatory Visit: Payer: 59 | Admitting: Physical Therapy

## 2017-02-08 ENCOUNTER — Encounter: Payer: Self-pay | Admitting: Physical Therapy

## 2017-02-08 DIAGNOSIS — R2231 Localized swelling, mass and lump, right upper limb: Secondary | ICD-10-CM

## 2017-02-08 DIAGNOSIS — M25511 Pain in right shoulder: Secondary | ICD-10-CM | POA: Diagnosis not present

## 2017-02-08 DIAGNOSIS — M25611 Stiffness of right shoulder, not elsewhere classified: Secondary | ICD-10-CM

## 2017-02-08 NOTE — Therapy (Signed)
[place], [place], [address] Phone: [phone]   Fax:  [phone]  Physical Therapy Treatment  Patient Details  Name: Omar Anderson MRN: [id number] Date of Birth: 02-23-1996 Referring Provider: Jerline Pain  Encounter Date: 02/08/2017      PT End of Session - 02/08/17 1551    Visit Number 20   Date for PT Re-Evaluation 02/28/17   PT Start Time 4332   PT Stop Time 1610   PT Time Calculation (min) 55 min      History reviewed. No pertinent past medical history.  Past Surgical History:  Procedure Laterality Date  . TONSILLECTOMY AND ADENOIDECTOMY      There were no vitals filed for this visit.      Subjective Assessment - 02/08/17 1516    Subjective Pt reports that he has noticed over the last two days anything over head causes a neck crick. Other than that all is well.    Currently in Pain? No/denies   Pain Score 0-No pain                         [facility] Adult PT Treatment/Exercise - 02/08/17 0001      Shoulder Exercises: Standing   External Rotation Strengthening;Both;Theraband;20 reps   Theraband Level (Shoulder External Rotation) Level 2 (Red)   Internal Rotation Strengthening;Right;Weights;10 reps   Theraband Level (Shoulder Internal Rotation) Level 2 (Red)   Other Standing Exercises wall push ups 2x15, Standing rev grip rows 20lb 2x10; Arcs with pillow case x10 x5    Other Standing Exercises 3# and 4# overhead carry 150' each; Cross body serratus presses 2lb x10 each      Shoulder Exercises: ROM/Strengthening   UBE (Upper Arm Bike) 4 minutes constant work 30 watts   Cybex Press 20 reps;1 plate   Cybex Press Limitations serratus push at end range   Cybex Row Limitations 20# 2X15   Wall Pushups 20 reps   "W" Arms 2x10, no weight   Other ROM/Strengthening Exercises lats 20# 2x15   Other ROM/Strengthening Exercises RUE Triceps #15 2x15, RUE Biceps 15#  2x15     Vasopneumatic   Number Minutes Vasopneumatic  15 minutes   Vasopnuematic Location  Shoulder   Vasopneumatic Pressure Low   Vasopneumatic Temperature  34                  PT Short Term Goals - 12/14/16 9518      PT SHORT TERM GOAL #1   Title independent with initial HEP   Status Achieved           PT Long Term Goals - 01/31/17 0934      PT LONG TERM GOAL #1   Title decrease pain 50%   Status Partially Met     PT LONG TERM GOAL #2   Title increase right shoulder ROM to WNL's   Status Partially Met     PT LONG TERM GOAL #3   Title lift 5# over head    Status Partially Met     PT LONG TERM GOAL #4   Title dresss and do hair without difficulty   Status Partially Met               Plan - 02/08/17 1553    Clinical Impression Statement continues to have som pain with overhead carry, progressed to single arm bicep and triceps strengthening. Progressing towards all goals.  Rehab Potential Good   PT Frequency 2x / week   PT Duration 8 weeks   PT Treatment/Interventions ADLs/Self Care Home Management;Cryotherapy;Electrical Stimulation;Patient/family education;Therapeutic exercise;Therapeutic activities;Manual techniques;Vasopneumatic Device;Passive range of motion   PT Next Visit Plan Continues to have so      Patient will benefit from skilled therapeutic intervention in order to improve the following deficits and impairments:  Decreased mobility, Decreased strength, Postural dysfunction, Improper body mechanics, Pain, Increased muscle spasms, Decreased range of motion, Impaired UE functional use  Visit Diagnosis: Acute pain of right shoulder  Stiffness of right shoulder, not elsewhere classified  Localized swelling, mass and lump, right upper limb     Problem List Patient Active Problem List   Diagnosis Date Noted  . Sports physical 02/06/2013    Scot Jun, PTA 02/08/2017, 3:55 PM  [place], [place], [address] Phone: [phone]   Fax:  [phone]  Name: ADRIC WREDE MRN: [id number] Date of Birth: [birth year]

## 2017-02-09 ENCOUNTER — Encounter: Payer: Self-pay | Admitting: Physical Therapy

## 2017-02-09 ENCOUNTER — Ambulatory Visit: Payer: 59 | Admitting: Physical Therapy

## 2017-02-09 DIAGNOSIS — M25511 Pain in right shoulder: Secondary | ICD-10-CM | POA: Diagnosis not present

## 2017-02-09 DIAGNOSIS — M25611 Stiffness of right shoulder, not elsewhere classified: Secondary | ICD-10-CM

## 2017-02-09 DIAGNOSIS — R2231 Localized swelling, mass and lump, right upper limb: Secondary | ICD-10-CM

## 2017-02-09 NOTE — Therapy (Signed)
[place], [place], [address] Phone: [phone]   Fax:  [phone]  Physical Therapy Treatment  Patient Details  Name: Omar Anderson MRN: [id number] Date of Birth: 01/09/1996 Referring Provider: Jerline Pain  Encounter Date: 02/09/2017      PT End of Session - 02/09/17 1005    Visit Number 21   Date for PT Re-Evaluation 02/28/17   PT Start Time 0930   PT Stop Time 1025   PT Time Calculation (min) 55 min   Activity Tolerance Patient tolerated treatment well   Behavior During Therapy [facility] for tasks assessed/performed      History reviewed. No pertinent past medical history.  Past Surgical History:  Procedure Laterality Date  . TONSILLECTOMY AND ADENOIDECTOMY      There were no vitals filed for this visit.      Subjective Assessment - 02/09/17 0931    Subjective "Just really tight"   Currently in Pain? No/denies   Pain Score 0-No pain                         [facility] Adult PT Treatment/Exercise - 02/09/17 0001      Shoulder Exercises: Supine   External Rotation 20 reps;Right;Strengthening;Weights   External Rotation Weight (lbs) 2   Internal Rotation 20 reps;Right;Weights;Strengthening   Internal Rotation Weight (lbs) 2   Other Supine Exercises 4lb chest press     Shoulder Exercises: Standing   Other Standing Exercises Cross body serratus presses 2lb x10 each      Shoulder Exercises: ROM/Strengthening   UBE (Upper Arm Bike) NuStep L3 Ue only x6 min    Cybex Press 20 reps;2 plate   Cybex Press Limitations serratus push at end range   Cybex Row Limitations 20# 2X15   Wall Pushups 20 reps   "W" Arms 2x10, no weight   Other ROM/Strengthening Exercises lats 20# 2x15; 4 way scap stab 2lb x10 each, 2lb front raises 2x10      Vasopneumatic   Number Minutes Vasopneumatic  15 minutes   Vasopnuematic Location  Shoulder   Vasopneumatic Temperature  34                   PT Short Term Goals - 12/14/16 0925      PT SHORT TERM GOAL #1   Title independent with initial HEP   Status Achieved           PT Long Term Goals - 01/31/17 0934      PT LONG TERM GOAL #1   Title decrease pain 50%   Status Partially Met     PT LONG TERM GOAL #2   Title increase right shoulder ROM to WNL's   Status Partially Met     PT LONG TERM GOAL #3   Title lift 5# over head    Status Partially Met     PT LONG TERM GOAL #4   Title dresss and do hair without difficulty   Status Partially Met               Plan - 02/09/17 1006    Clinical Impression Statement Some pain burning with 4 way scap stabe exercises. Pt also reports little pain with cross body serratus punches and front raises. Completed all other exercises well and is progressing towards all goals.    Rehab Potential Good   PT Frequency 2x / week   PT Duration  8 weeks   PT Treatment/Interventions ADLs/Self Care Home Management;Cryotherapy;Electrical Stimulation;Patient/family education;Therapeutic exercise;Therapeutic activities;Manual techniques;Vasopneumatic Device;Passive range of motion   PT Next Visit Plan progress per protocol       Patient will benefit from skilled therapeutic intervention in order to improve the following deficits and impairments:  Decreased mobility, Decreased strength, Postural dysfunction, Improper body mechanics, Pain, Increased muscle spasms, Decreased range of motion, Impaired UE functional use  Visit Diagnosis: Acute pain of right shoulder  Stiffness of right shoulder, not elsewhere classified  Localized swelling, mass and lump, right upper limb     Problem List Patient Active Problem List   Diagnosis Date Noted  . Sports physical 02/06/2013    Scot Jun, PTA 02/09/2017, 10:10 AM  [place], [place], [address] Phone: [phone]    Fax:  [phone]  Name: Omar Anderson MRN: [id number] Date of Birth: [birth year]

## 2017-02-22 ENCOUNTER — Encounter: Payer: Self-pay | Admitting: Physical Therapy

## 2017-02-22 ENCOUNTER — Ambulatory Visit: Payer: 59 | Attending: Student | Admitting: Physical Therapy

## 2017-02-22 DIAGNOSIS — M25611 Stiffness of right shoulder, not elsewhere classified: Secondary | ICD-10-CM

## 2017-02-22 DIAGNOSIS — R2231 Localized swelling, mass and lump, right upper limb: Secondary | ICD-10-CM | POA: Diagnosis present

## 2017-02-22 DIAGNOSIS — M25511 Pain in right shoulder: Secondary | ICD-10-CM | POA: Diagnosis not present

## 2017-02-22 NOTE — Therapy (Signed)
[place], [place], [address] Phone: [phone]   Fax:  [phone]  Physical Therapy Treatment  Patient Details  Name: Omar Anderson MRN: [id number] Date of Birth: 28-Aug-1995 Referring Provider: Jerline Pain  Encounter Date: 02/22/2017      PT End of Session - 02/22/17 0917    Visit Number 22   Date for PT Re-Evaluation 02/28/17   PT Start Time 0841   PT Stop Time 0935   PT Time Calculation (min) 54 min   Activity Tolerance Patient tolerated treatment well   Behavior During Therapy [facility] for tasks assessed/performed      History reviewed. No pertinent past medical history.  Past Surgical History:  Procedure Laterality Date  . TONSILLECTOMY AND ADENOIDECTOMY      There were no vitals filed for this visit.      Subjective Assessment - 02/22/17 0844    Subjective "Not too bad"   Currently in Pain? Yes   Pain Score 1    Pain Location Shoulder   Pain Orientation Right            [facility] PT Assessment - 02/22/17 0001      AROM   AROM Assessment Site Shoulder   Right/Left Shoulder Right   Right Shoulder Flexion 164 Degrees   Right Shoulder ABduction 134 Degrees                     [facility] Adult PT Treatment/Exercise - 02/22/17 0001      Shoulder Exercises: Supine   External Rotation Right;Strengthening;Weights;15 reps  x2   External Rotation Weight (lbs) 2   Internal Rotation Right;Weights;Strengthening;15 reps  x2   Internal Rotation Weight (lbs) 2   Other Supine Exercises 5lb chest press serratus punch 2x10     Shoulder Exercises: Standing   Other Standing Exercises Arcs on wall with pillow case x10; wall push ups 2x15; R shoulder flexion on wall 3lb 2x10    Other Standing Exercises Cross body serratus presses 3lb 2x10 each; 4 way scap stab 1b x10 each; Bent over row & rev flys 3lb  x15      Shoulder Exercises: ROM/Strengthening   UBE (Upper Arm Bike) L4  86fd/3rev     Vasopneumatic   Number Minutes Vasopneumatic  15 minutes   Vasopnuematic Location  Shoulder   Vasopneumatic Pressure Low   Vasopneumatic Temperature  34                  PT Short Term Goals - 12/14/16 0925      PT SHORT TERM GOAL #1   Title independent with initial HEP   Status Achieved           PT Long Term Goals - 01/31/17 0934      PT LONG TERM GOAL #1   Title decrease pain 50%   Status Partially Met     PT LONG TERM GOAL #2   Title increase right shoulder ROM to WNL's   Status Partially Met     PT LONG TERM GOAL #3   Title lift 5# over head    Status Partially Met     PT LONG TERM GOAL #4   Title dresss and do hair without difficulty   Status Partially Met               Plan - 02/22/17 0918    Clinical Impression Statement Pt continues to progress towards all  goals increasing r shoulder AROM. AROM taken in  standing and pt does report some pain at end range. Some pain reported with seated bent over reverse fly's.    Rehab Potential Good   PT Frequency 2x / week   PT Duration 8 weeks   PT Treatment/Interventions ADLs/Self Care Home Management;Cryotherapy;Electrical Stimulation;Patient/family education;Therapeutic exercise;Therapeutic activities;Manual techniques;Vasopneumatic Device;Passive range of motion   PT Next Visit Plan progress per protocal       Patient will benefit from skilled therapeutic intervention in order to improve the following deficits and impairments:  Decreased mobility, Decreased strength, Postural dysfunction, Improper body mechanics, Pain, Increased muscle spasms, Decreased range of motion, Impaired UE functional use  Visit Diagnosis: Acute pain of right shoulder  Stiffness of right shoulder, not elsewhere classified  Localized swelling, mass and lump, right upper limb     Problem List Patient Active Problem List   Diagnosis Date Noted  . Sports physical 02/06/2013    Omar Anderson,  PTA 02/22/2017, [place], [place], [address] Phone: [phone]   Fax:  [phone]  Name: Omar Anderson MRN: [id number] Date of Birth: 1996/01/29

## 2017-02-23 ENCOUNTER — Encounter: Payer: Self-pay | Admitting: Physical Therapy

## 2017-02-23 ENCOUNTER — Ambulatory Visit: Payer: 59 | Admitting: Physical Therapy

## 2017-02-23 DIAGNOSIS — M25511 Pain in right shoulder: Secondary | ICD-10-CM

## 2017-02-23 DIAGNOSIS — R2231 Localized swelling, mass and lump, right upper limb: Secondary | ICD-10-CM

## 2017-02-23 DIAGNOSIS — M25611 Stiffness of right shoulder, not elsewhere classified: Secondary | ICD-10-CM

## 2017-02-23 NOTE — Therapy (Signed)
[place], [place], [address] Phone: [phone]   Fax:  [phone]  Physical Therapy Treatment  Patient Details  Name: Omar Anderson MRN: [id number] Date of Birth: [birth year] Referring Provider: Jerline Pain  Encounter Date: 02/23/2017      PT End of Session - 02/23/17 0927    Visit Number 23   PT Start Time 0845   PT Stop Time 0942   PT Time Calculation (min) 57 min   Activity Tolerance Patient tolerated treatment well   Behavior During Therapy [facility] for tasks assessed/performed      History reviewed. No pertinent past medical history.  Past Surgical History:  Procedure Laterality Date  . TONSILLECTOMY AND ADENOIDECTOMY      There were no vitals filed for this visit.      Subjective Assessment - 02/23/17 0847    Subjective "It is a little sore from yesterday, but it is ok"   Currently in Pain? Yes   Pain Score 2    Pain Location Shoulder   Pain Orientation Right                         [facility] Adult PT Treatment/Exercise - 02/23/17 0001      Shoulder Exercises: Standing   Other Standing Exercises Arcs on wall with pillow case x10; wall push ups 2x15; R shoulder flexion on wall 4lb 2x10, Triceps press downs, Bicep curls  35lb 2x15    Other Standing Exercises 4 way scap stab 2lb x10 each; Bent over row & rev flys 3lb  2x15; Swimmers 15lb 2x10     Shoulder Exercises: ROM/Strengthening   UBE (Upper Arm Bike) NuStep L2 x4 min UE only; UBE L3 33fd/2rev    Cybex Row Limitations 25# 2X10   "W" Arms 2x10, no weight   Other ROM/Strengthening Exercises lats 25# 2x10     Vasopneumatic   Number Minutes Vasopneumatic  15 minutes   Vasopnuematic Location  Shoulder   Vasopneumatic Pressure Low   Vasopneumatic Temperature  34                  PT Short Term Goals - 12/14/16 03154     PT SHORT TERM GOAL #1   Title independent with initial HEP   Status  Achieved           PT Long Term Goals - 01/31/17 0934      PT LONG TERM GOAL #1   Title decrease pain 50%   Status Partially Met     PT LONG TERM GOAL #2   Title increase right shoulder ROM to WNL's   Status Partially Met     PT LONG TERM GOAL #3   Title lift 5# over head    Status Partially Met     PT LONG TERM GOAL #4   Title dresss and do hair without difficulty   Status Partially Met               Plan - 02/23/17 0928    Clinical Impression Statement Pt reports pain with seated bent over rows and fly's. Pt also reports some pain with swimmers at end range. Completed all other interventions well.    Rehab Potential Good   PT Frequency 2x / week   PT Duration 8 weeks   PT Treatment/Interventions ADLs/Self Care Home Management;Cryotherapy;Electrical Stimulation;Patient/family education;Therapeutic exercise;Therapeutic activities;Manual techniques;Vasopneumatic Device;Passive range of motion   PT  Next Visit Plan progress per protocal       Patient will benefit from skilled therapeutic intervention in order to improve the following deficits and impairments:  Decreased mobility, Decreased strength, Postural dysfunction, Improper body mechanics, Pain, Increased muscle spasms, Decreased range of motion, Impaired UE functional use  Visit Diagnosis: Stiffness of right shoulder, not elsewhere classified  Acute pain of right shoulder  Localized swelling, mass and lump, right upper limb     Problem List Patient Active Problem List   Diagnosis Date Noted  . Sports physical 02/06/2013    Scot Jun, PTA 02/23/2017, 9:30 AM  [place], [place], [address] Phone: [phone]   Fax:  [phone]  Name: DARYN HICKS MRN: [id number] Date of Birth: Aug 12, 1995

## 2017-02-27 ENCOUNTER — Ambulatory Visit: Payer: 59 | Admitting: Physical Therapy

## 2017-02-28 ENCOUNTER — Encounter: Payer: Self-pay | Admitting: Physical Therapy

## 2017-02-28 ENCOUNTER — Ambulatory Visit: Payer: 59 | Admitting: Physical Therapy

## 2017-02-28 DIAGNOSIS — M25611 Stiffness of right shoulder, not elsewhere classified: Secondary | ICD-10-CM

## 2017-02-28 DIAGNOSIS — R2231 Localized swelling, mass and lump, right upper limb: Secondary | ICD-10-CM

## 2017-02-28 DIAGNOSIS — M25511 Pain in right shoulder: Secondary | ICD-10-CM | POA: Diagnosis not present

## 2017-02-28 NOTE — Therapy (Signed)
[place], [place], [address] Phone: [phone]   Fax:  [phone]  Physical Therapy Treatment  Patient Details  Name: Omar Anderson MRN: [id number] Date of Birth: October 10, 1995 Referring Provider: Jerline Pain  Encounter Date: 02/28/2017      PT End of Session - 02/28/17 1012    Visit Number 24   Date for PT Re-Evaluation 02/28/17   PT Start Time 0930   PT Stop Time 1027   PT Time Calculation (min) 57 min   Activity Tolerance Patient tolerated treatment well   Behavior During Therapy [facility] for tasks assessed/performed      History reviewed. No pertinent past medical history.  Past Surgical History:  Procedure Laterality Date  . TONSILLECTOMY AND ADENOIDECTOMY      There were no vitals filed for this visit.      Subjective Assessment - 02/28/17 0934    Subjective "Pretty good just tight"   Currently in Pain? Yes   Pain Score 1    Pain Location Shoulder   Pain Orientation Right                         [facility] Adult PT Treatment/Exercise - 02/28/17 0001      Shoulder Exercises: Standing   Flexion Weights;Both;20 reps   Shoulder Flexion Weight (lbs) 2   ABduction 20 reps;Strengthening;10 reps;Both   Shoulder ABduction Weight (lbs) 2   Other Standing Exercises Arcs on wall with pillow case x10; wall push ups 2x15; R shoulder flexion on wall 5lb 2x10, Tricept press downs, Bicept curls  35lb 2x15; Isometric ER with flex 2x15   Other Standing Exercises Bent over row & rev flys 3lb  2x15; Swimmers 15lb 2x10     Shoulder Exercises: ROM/Strengthening   UBE (Upper Arm Bike) L5 109fd/3rev   Cybex Row Limitations 25# 2X15     Vasopneumatic   Number Minutes Vasopneumatic  15 minutes   Vasopnuematic Location  Shoulder   Vasopneumatic Pressure Low   Vasopneumatic Temperature  34                  PT Short Term Goals - 12/14/16 0925      PT SHORT TERM GOAL #1    Title independent with initial HEP   Status Achieved           PT Long Term Goals - 01/31/17 0934      PT LONG TERM GOAL #1   Title decrease pain 50%   Status Partially Met     PT LONG TERM GOAL #2   Title increase right shoulder ROM to WNL's   Status Partially Met     PT LONG TERM GOAL #3   Title lift 5# over head    Status Partially Met     PT LONG TERM GOAL #4   Title dresss and do hair without difficulty   Status Partially Met               Plan - 02/28/17 1012    Clinical Impression Statement Pt continues to reports pain with bent over fly's and rows. Pt reports pain with any overhead movements. Pt also reports difficulty with backpack strap over shoulder and with sleeping.    Rehab Potential Good   PT Frequency 2x / week   PT Duration 8 weeks   PT Treatment/Interventions ADLs/Self Care Home Management;Cryotherapy;Electrical Stimulation;Patient/family education;Therapeutic exercise;Therapeutic activities;Manual techniques;Vasopneumatic Device;Passive range of  motion   PT Next Visit Plan progress per protocol       Patient will benefit from skilled therapeutic intervention in order to improve the following deficits and impairments:  Decreased mobility, Decreased strength, Postural dysfunction, Improper body mechanics, Pain, Increased muscle spasms, Decreased range of motion, Impaired UE functional use  Visit Diagnosis: Stiffness of right shoulder, not elsewhere classified  Acute pain of right shoulder  Localized swelling, mass and lump, right upper limb     Problem List Patient Active Problem List   Diagnosis Date Noted  . Sports physical 02/06/2013    Scot Jun, PTA 02/28/2017, 10:14 AM  [place], [place], [address] Phone: [phone]   Fax:  [phone]  Name: OBERT ESPINDOLA MRN: [id number] Date of Birth: October 05, 1995

## 2017-03-02 ENCOUNTER — Encounter: Payer: Self-pay | Admitting: Physical Therapy

## 2017-03-02 ENCOUNTER — Ambulatory Visit: Payer: 59 | Admitting: Physical Therapy

## 2017-03-02 DIAGNOSIS — M25511 Pain in right shoulder: Secondary | ICD-10-CM

## 2017-03-02 DIAGNOSIS — R2231 Localized swelling, mass and lump, right upper limb: Secondary | ICD-10-CM

## 2017-03-02 DIAGNOSIS — M25611 Stiffness of right shoulder, not elsewhere classified: Secondary | ICD-10-CM

## 2017-03-02 NOTE — Therapy (Signed)
[place], [place], [address] Phone: [phone]   Fax:  [phone]  Physical Therapy Treatment  Patient Details  Name: Omar Anderson MRN: [id number] Date of Birth: 1996/03/13 Referring Provider: Jerline Pain  Encounter Date: 03/02/2017      PT End of Session - 03/02/17 1008    Visit Number 25   PT Start Time 0929   PT Stop Time 1023   PT Time Calculation (min) 54 min   Activity Tolerance Patient tolerated treatment well   Behavior During Therapy [facility] for tasks assessed/performed      History reviewed. No pertinent past medical history.  Past Surgical History:  Procedure Laterality Date  . TONSILLECTOMY AND ADENOIDECTOMY      There were no vitals filed for this visit.      Subjective Assessment - 03/02/17 0935    Subjective Pt reports that things are going fine, still has some difficulty taking off his shirt and overhead lifting.   Currently in Pain? No/denies   Pain Score 0-No pain            [facility] PT Assessment - 03/02/17 0001      AROM   AROM Assessment Site Shoulder  with pain    Right/Left Shoulder Right   Right Shoulder Flexion 165 Degrees   Right Shoulder ABduction 160 Degrees                     [facility] Adult PT Treatment/Exercise - 03/02/17 0001      Shoulder Exercises: Standing   External Rotation Strengthening;Both;Theraband;20 reps   Theraband Level (Shoulder External Rotation) Level 2 (Red)   Flexion Weights;Both;20 reps   Shoulder Flexion Weight (lbs) 3   ABduction Strengthening;10 reps;Both;15 reps  x2   Shoulder ABduction Weight (lbs) 3   Other Standing Exercises Arcs on wall with pillow case x10; wall push ups 2x15;  Isometric ER with flex 2x15   Other Standing Exercises Bent over row & rev flys 3lb  2x15; Swimmers 15lb 2x10     Shoulder Exercises: ROM/Strengthening   UBE (Upper Arm Bike) L6 30fd/3rev   Cybex Row Limitations 25#  2X15   Other ROM/Strengthening Exercises lats 25# 2x15     Vasopneumatic   Number Minutes Vasopneumatic  15 minutes   Vasopnuematic Location  Shoulder   Vasopneumatic Pressure Low   Vasopneumatic Temperature  34                  PT Short Term Goals - 12/14/16 0925      PT SHORT TERM GOAL #1   Title independent with initial HEP   Status Achieved           PT Long Term Goals - 03/02/17 1010      PT LONG TERM GOAL #1   Title decrease pain 50%   Status Partially Met     PT LONG TERM GOAL #2   Title increase right shoulder ROM to WNL's   Status Partially Met     PT LONG TERM GOAL #3   Title lift 5# over head    Status Partially Met     PT LONG TERM GOAL #4   Title dresss and do hair without difficulty   Status Partially Met               Plan - 03/02/17 1008    Clinical Impression Statement Pt able to complete all of today's exercises,  he does reports some pain with bent over rows and fly's. Pt has progressed increasing his R shoulder AROM. Pt reports that he will be leaving for [place] this afternoon and will be continuing his therapy there.   Rehab Potential Good   PT Frequency 2x / week   PT Duration 8 weeks   PT Treatment/Interventions ADLs/Self Care Home Management;Cryotherapy;Electrical Stimulation;Patient/family education;Therapeutic exercise;Therapeutic activities;Manual techniques;Vasopneumatic Device;Passive range of motion   PT Next Visit Plan progress per protocol, D/C pt        Patient will benefit from skilled therapeutic intervention in order to improve the following deficits and impairments:  Decreased mobility, Decreased strength, Postural dysfunction, Improper body mechanics, Pain, Increased muscle spasms, Decreased range of motion, Impaired UE functional use  Visit Diagnosis: Stiffness of right shoulder, not elsewhere classified  Acute pain of right shoulder  Localized swelling, mass and lump, right upper limb     Problem  List Patient Active Problem List   Diagnosis Date Noted  . Sports physical 02/06/2013    Scot Jun 03/02/2017, 10:11 AM  [place], [place], [address] Phone: [phone]   Fax:  [phone]  Name: Omar Anderson MRN: [id number] Date of Birth: October 03, 1995

## 2021-03-09 ENCOUNTER — Ambulatory Visit
Admission: RE | Admit: 2021-03-09 | Discharge: 2021-03-09 | Disposition: A | Discharge status: Home / Self Care | Payer: No Typology Code available for payment source | Source: Ambulatory Visit | Attending: Family Medicine | Admitting: Family Medicine

## 2021-03-09 ENCOUNTER — Other Ambulatory Visit: Payer: Self-pay | Admitting: Family Medicine

## 2021-03-09 DIAGNOSIS — R0781 Pleurodynia: Secondary | ICD-10-CM

## 2021-03-09 DIAGNOSIS — R079 Chest pain, unspecified: Secondary | ICD-10-CM

## 2022-12-29 IMAGING — DX DG CHEST 2V
2 series · 2 of 2 positions shown · non-contrast
Comparison: None.

CLINICAL DATA: Left-sided chest pain.  Rib pain.

EXAM:
CHEST - 2 VIEW

[dg chest 2 view (1 of 2)]
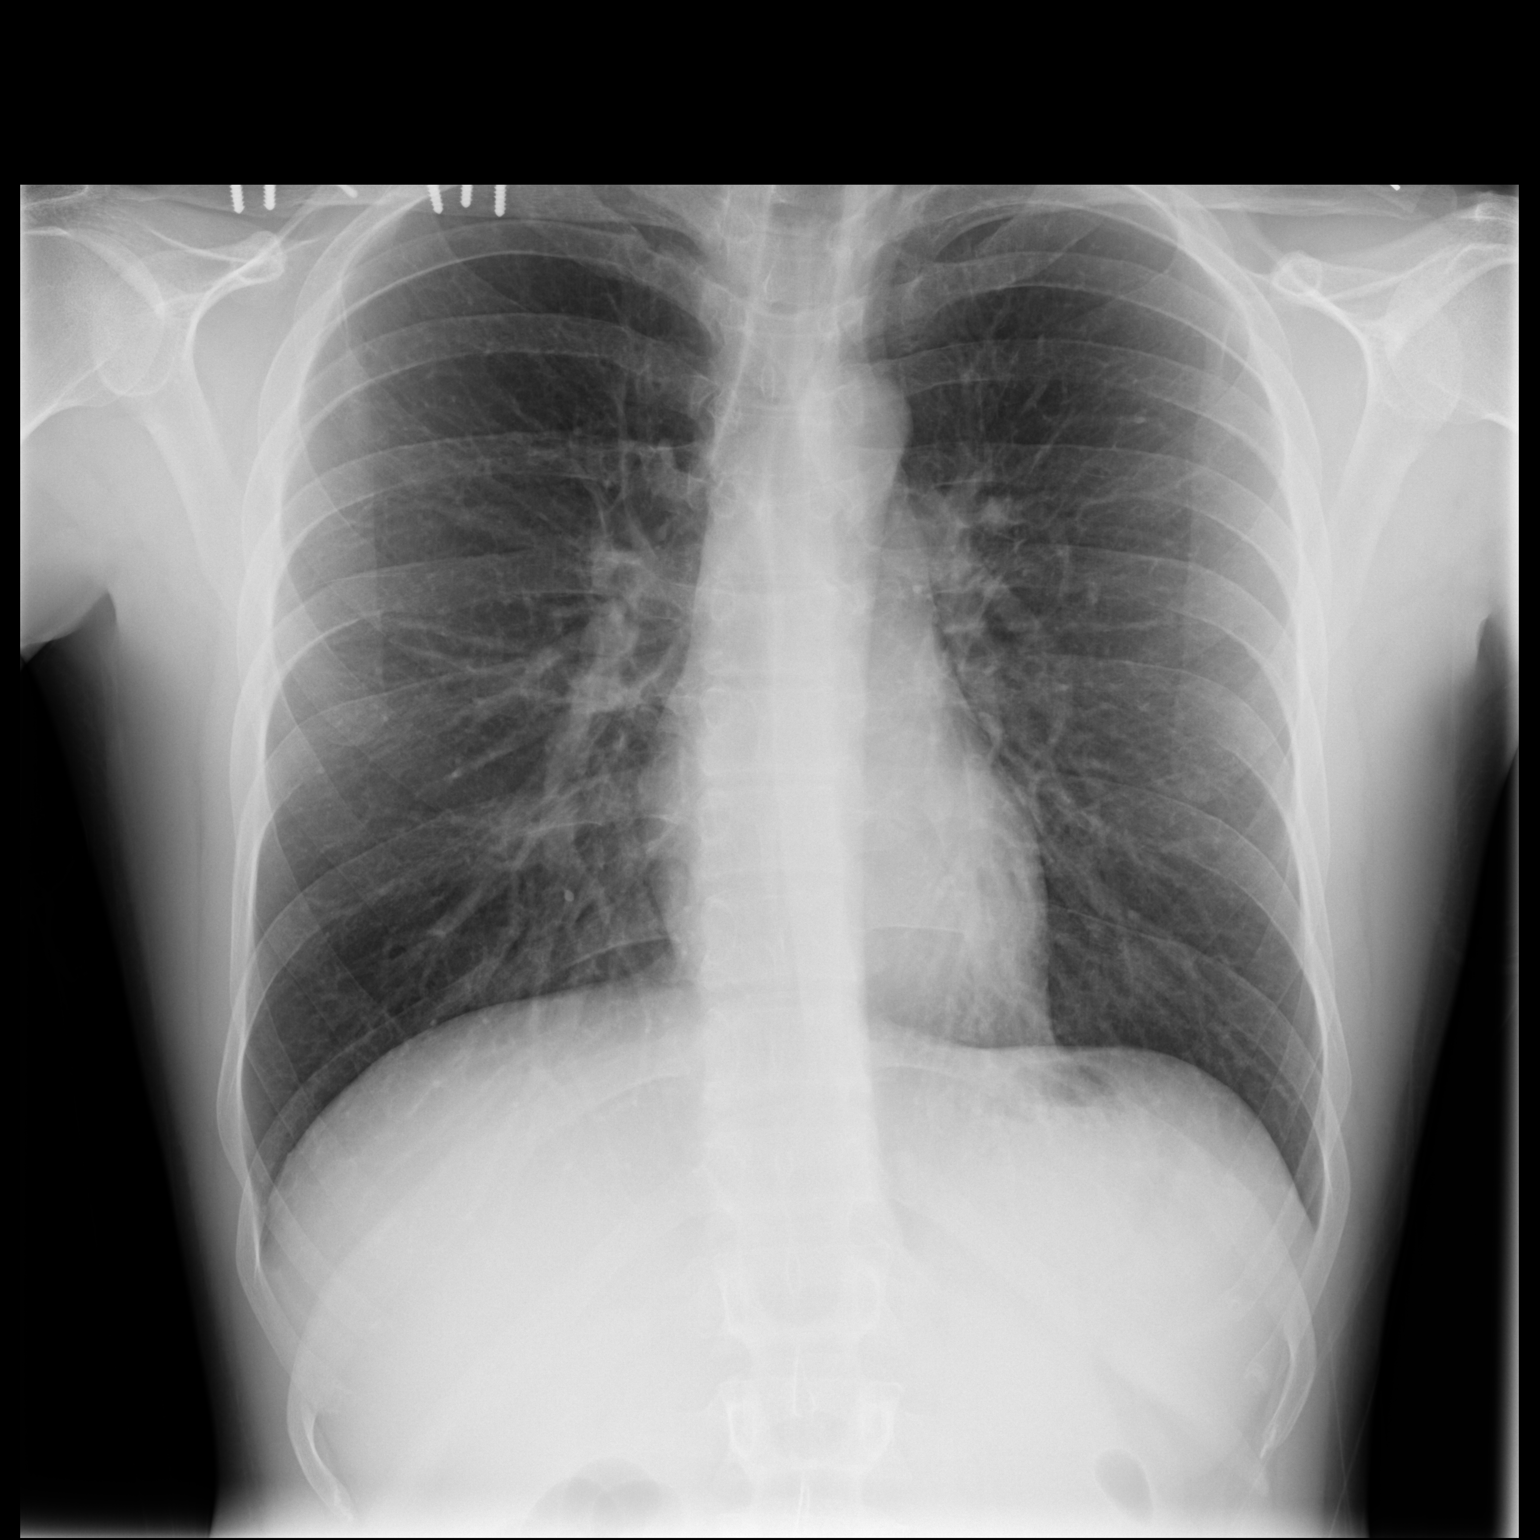

[dg chest 2 view (2 of 2)]
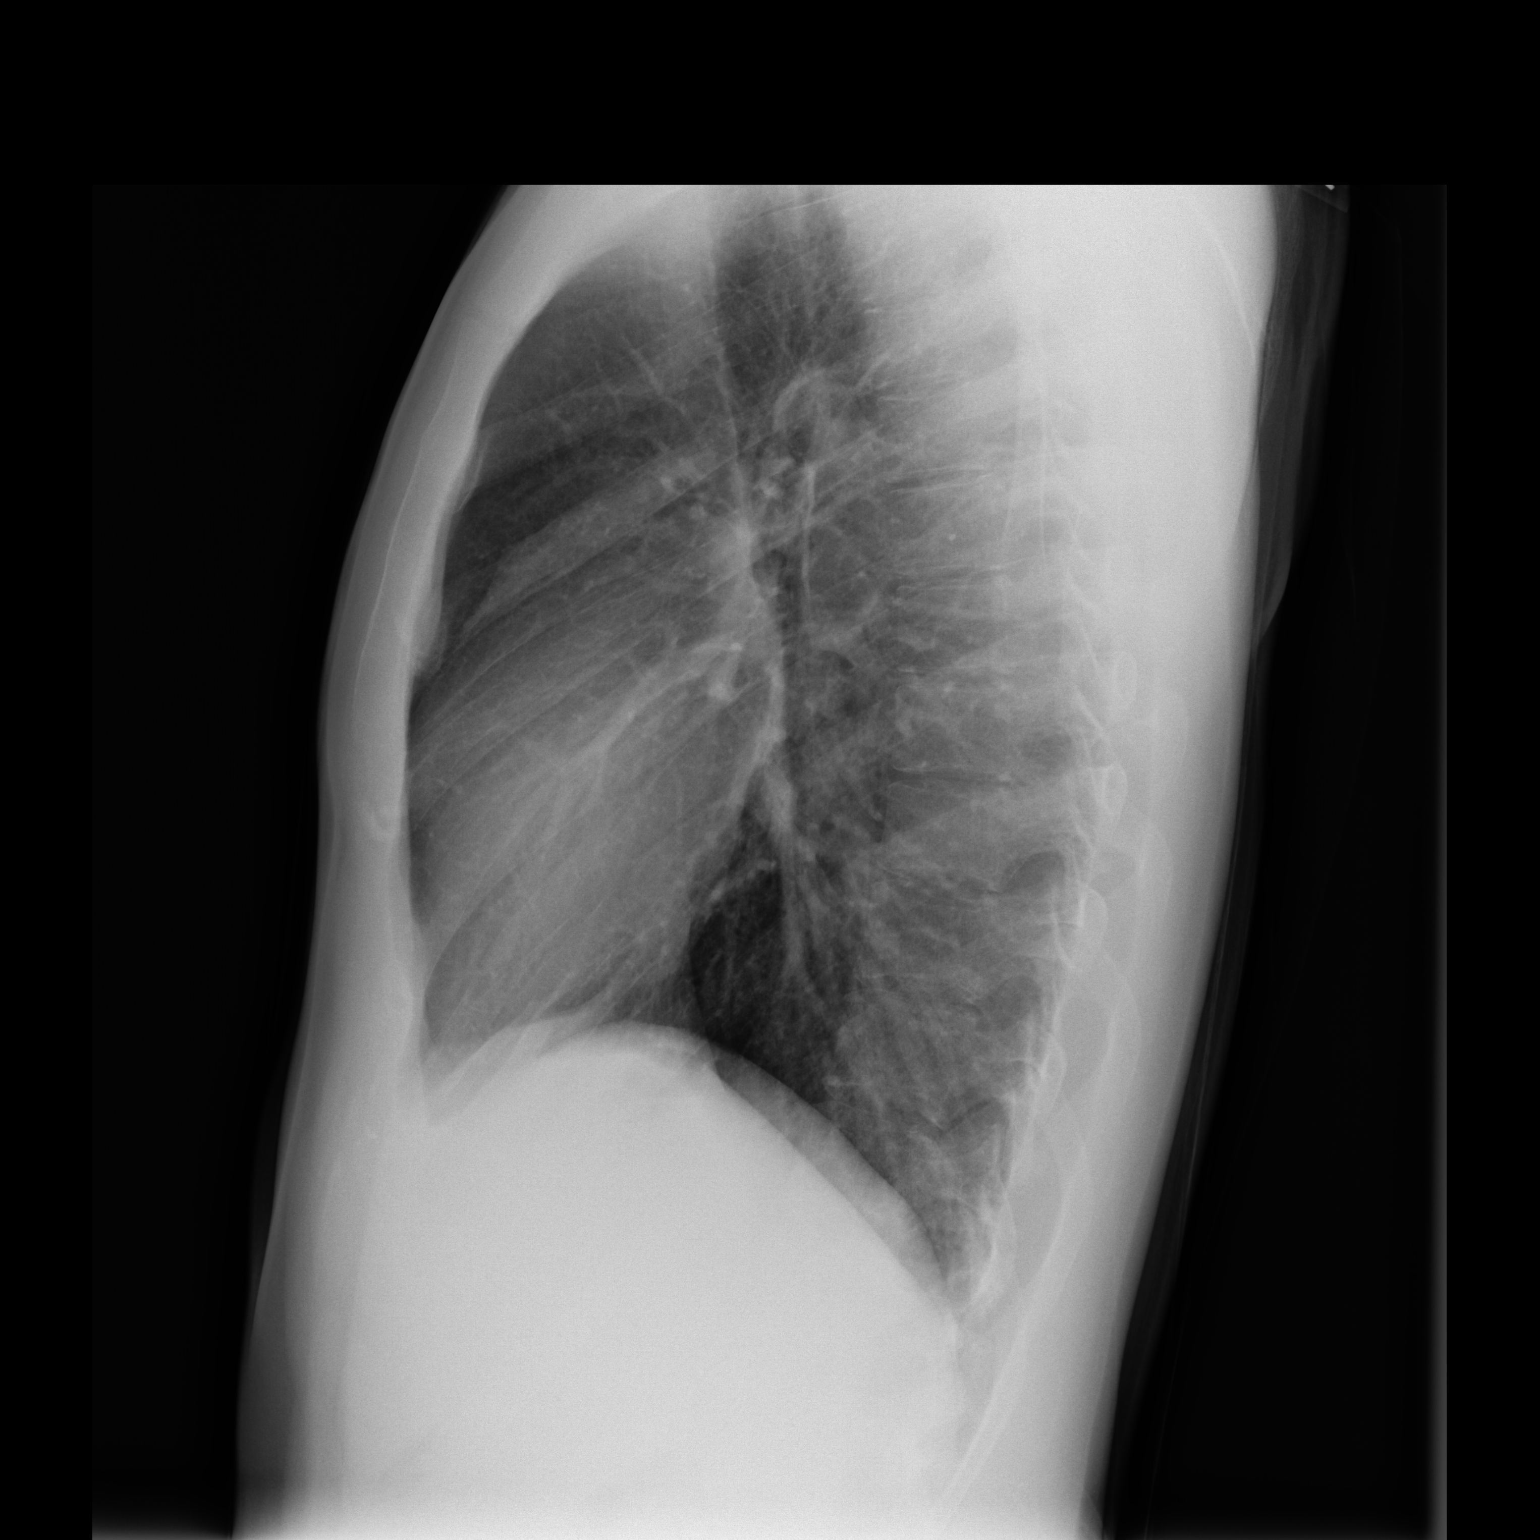

[2 of 2 positions shown; findings below may reference images not displayed]

FINDINGS: The cardiomediastinal contours are normal. The lungs are clear.
Pulmonary vasculature is normal. No consolidation, pleural effusion,
or pneumothorax. Plate and screw fixation of right clavicle. No
acute osseous abnormalities are seen. No visualized rib fracture
IMPRESSION: Negative radiographs of the chest.
